# Patient Record
Sex: Male | Born: 1955 | Race: White | Hispanic: No | Marital: Single | Smoking: Former smoker
Health system: Southern US, Community
[De-identification: ages and names within clinical notes are randomized; demographics above are authoritative.]

## PROBLEM LIST (undated history)

## (undated) DIAGNOSIS — M1990 Unspecified osteoarthritis, unspecified site: Secondary | ICD-10-CM

## (undated) DIAGNOSIS — G8929 Other chronic pain: Secondary | ICD-10-CM

## (undated) DIAGNOSIS — I4891 Unspecified atrial fibrillation: Secondary | ICD-10-CM

## (undated) DIAGNOSIS — M549 Dorsalgia, unspecified: Secondary | ICD-10-CM

## (undated) DIAGNOSIS — I10 Essential (primary) hypertension: Secondary | ICD-10-CM

## (undated) DIAGNOSIS — E785 Hyperlipidemia, unspecified: Secondary | ICD-10-CM

## (undated) HISTORY — PX: TONSILLECTOMY: SUR1361

---

## 2015-02-25 ENCOUNTER — Ambulatory Visit
Admission: RE | Admit: 2015-02-25 | Discharge: 2015-02-25 | Disposition: A | Discharge status: Home / Self Care | Payer: Disability Insurance | Source: Ambulatory Visit

## 2015-02-25 ENCOUNTER — Other Ambulatory Visit: Payer: Self-pay

## 2015-02-25 DIAGNOSIS — G8929 Other chronic pain: Secondary | ICD-10-CM | POA: Diagnosis not present

## 2015-02-25 DIAGNOSIS — M259 Joint disorder, unspecified: Secondary | ICD-10-CM

## 2015-02-25 DIAGNOSIS — M19071 Primary osteoarthritis, right ankle and foot: Secondary | ICD-10-CM | POA: Diagnosis not present

## 2015-02-25 DIAGNOSIS — M4186 Other forms of scoliosis, lumbar region: Secondary | ICD-10-CM | POA: Insufficient documentation

## 2015-02-25 DIAGNOSIS — M539 Dorsopathy, unspecified: Secondary | ICD-10-CM | POA: Diagnosis not present

## 2015-02-25 DIAGNOSIS — M25561 Pain in right knee: Secondary | ICD-10-CM | POA: Diagnosis not present

## 2015-04-19 ENCOUNTER — Emergency Department
Admission: EM | Admit: 2015-04-19 | Discharge: 2015-04-19 | Disposition: A | Discharge status: Home / Self Care | Payer: Medicaid Other | Attending: Emergency Medicine | Admitting: Emergency Medicine

## 2015-04-19 DIAGNOSIS — M542 Cervicalgia: Secondary | ICD-10-CM | POA: Diagnosis not present

## 2015-04-19 DIAGNOSIS — M549 Dorsalgia, unspecified: Secondary | ICD-10-CM | POA: Insufficient documentation

## 2015-04-19 DIAGNOSIS — I10 Essential (primary) hypertension: Secondary | ICD-10-CM | POA: Insufficient documentation

## 2015-04-19 DIAGNOSIS — G8929 Other chronic pain: Secondary | ICD-10-CM

## 2015-04-19 DIAGNOSIS — Z87891 Personal history of nicotine dependence: Secondary | ICD-10-CM | POA: Insufficient documentation

## 2015-04-19 HISTORY — DX: Essential (primary) hypertension: I10

## 2015-04-19 MED ORDER — IBUPROFEN 800 MG PO TABS
800.0000 mg | ORAL_TABLET | ORAL | Status: AC
  Administered 2015-04-19: 800 mg via ORAL
  Filled 2015-04-19: qty 1

## 2015-04-19 NOTE — ED Provider Notes (Signed)
[facility] Emergency Department Provider Note  ____________________________________________  Time seen: Approximately 6:27 PM  I have reviewed the triage vital signs and the nursing notes.   HISTORY  Chief Complaint Neck Pain and Back Pain    HPI Russell Jacobson is a 60 y.o. male reports that he has a history of high blood pressure and severe chronic pain in his neck and back.  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 Today he was out for a walk and he had a flare of the pain which she does experience from time to time, but he was in a public place. The pain would become severe it shoots down his left neck and his left leg sometimes he has to sit down. He uses a cane due to chronic weakness in the left leg from his back. He had to lower himself to the ground and the paramedics were called by witnesses.  Patient reports a severe shooting pain in the left neck that shoots from the left side of the neck into the left shoulder. He denies any new pain in his lower back. He did not fall or injure himself. He does report he has had this pain several times before and that usually he takes ibuprofen help quell the pain.  He has no new weakness. Denies chest pain or trouble breathing. He has not been sick recently.  The patient does see Dr. Maryellen Pile and reports he is currently getting evaluated for disability through him for the same.  I offered the patient pain medicine including Vicodin as he looks like he is in severe pain, but he refused it. Patient states he would only want to take ibuprofen and does not wish to be given any strong pain medicine at all.   Past Medical History  Diagnosis Date  . Hypertension     Hypertension  There are no active problems to display for this patient.   Past Surgical History  Procedure  Laterality Date  . Tonsillectomy    . Knee surgery      No current outpatient prescriptions on file.  Allergies Review of patient's allergies indicates no known allergies.  No family history on file.  Social History Social History  Substance Use Topics  . Smoking status: None  . Smokeless tobacco: None  . Alcohol Use: Yes     Comment: 12oz whiskey daily  History of smoking Uses alcohol, occasional daily use.  Review of Systems Constitutional: No fever/chills Eyes: No visual changes. ENT: No sore throat. Cardiovascular: Denies chest pain. Respiratory: Denies shortness of breath. Gastrointestinal: No abdominal pain.  No nausea, no vomiting.  No diarrhea.  No constipation. Genitourinary: Negative for dysuria. Musculoskeletal: Negative for back pain. Skin: Negative for rash. Neurological: Negative for headaches, focal weakness or numbness except for some chronic numbness in the foot and the left lower leg and some mild weakness in the left lower leg that he's had for years. No bowel or bladder incontinence.  He denies falling striking his head or injuring his neck.  10-point ROS otherwise negative.  ____________________________________________   PHYSICAL EXAM:  VITAL SIGNS: ED Triage Vitals  Enc Vitals Group     BP --      Pulse --      Resp --      Temp --  Temp src --      SpO2 --      Weight --      Height --      Head Cir --      Peak Flow --      Pain Score --      Pain Loc --      Pain Edu? --      Excl. in GC? --    Constitutional: Alert and oriented. Well appearing and in no acute distress though he does appear to be in moderate pain. Eyes: Conjunctivae are normal. PERRL. EOMI. Head: Atraumatic. Nose: No congestion/rhinnorhea. Mouth/Throat: Mucous membranes are moist.  Oropharynx non-erythematous. Neck: No stridor.  No midline cervical tenderness, but the patient does report pain over the left paraspinous muscles. Cardiovascular: Normal rate,  regular rhythm. Grossly normal heart sounds.  Good peripheral circulation. Respiratory: Normal respiratory effort.  No retractions. Lungs CTAB. Gastrointestinal: Soft and nontender. No distention. No abdominal bruits. No CVA tenderness. Musculoskeletal: No lower extremity tenderness nor edema.  No joint effusions. Mild dulling of sensation across the left lower leg which the patient reports is chronic.  RIGHT Right upper extremity demonstrates normal strength, good use of all muscles. No edema bruising or contusions of the right shoulder/upper arm, right elbow, right forearm / hand. Full range of motion of the right right upper extremity without pain. No evidence of trauma. Strong radial pulse. Intact median/ulnar/radial neuro-muscular exam.  LEFT Left upper extremity demonstrates normal strength, good use of all muscles. No edema bruising or contusions of the left shoulder/upper arm, left elbow, left forearm / hand. Full range of motion of the left  upper extremity without pain. No evidence of trauma. Strong radial pulse. Intact median/ulnar/radial neuro-muscular exam.  Lower Extremities  No edema. Normal DP/PT pulses bilateral with good cap refill.  Normal neuro-motor function lower extremities bilateral.  RIGHT Right lower extremity demonstrates normal strength, good use of all muscles. No edema bruising or contusions of the right hip, right knee, right ankle. Full range of motion of the right lower extremity without pain. No pain on axial loading. No evidence of trauma.  LEFT Left lower extremity demonstrates normal strength, good use of all muscles rated 5 out of 5 however compared to the left there does seem to be some slight weakness in the hip flexors and extensors, the patient reports that this is been a chronic problem and is unchanged for approximately 5+ years. No edema bruising or contusions of the hip,  knee, ankle. Full range of motion of the left lower extremity without pain. No  pain on axial loading. No evidence of trauma.  Neurologic:  Normal speech and language. No gross focal neurologic deficits are appreciated. Does have some just very mild weakness in the left hip flexors and extensors which she reports is chronic due to problems with his back.Skin:  Skin is warm, dry and intact. No rash noted. Psychiatric: Mood and affect are normal. Speech and behavior are normal.  ____________________________________________   LABS (all labs ordered are listed, but only abnormal results are displayed)  Labs Reviewed - No data to display ____________________________________________  EKG   ____________________________________________  RADIOLOGY   ____________________________________________   PROCEDURES  Procedure(s) performed: None  Critical Care performed: No  ____________________________________________   INITIAL IMPRESSION / ASSESSMENT AND PLAN / ED COURSE  Pertinent labs & imaging results that were available during my care of the patient were reviewed by me and considered in my medical decision making (see chart  for details).  Patient presents for evaluation of pain. He reports that he's had the same pain many many times, been an ongoing issue since being injured [place]. He is currently followed with disability. He has no evidence of acute neurologic deficit except for what he reports is chronic weakness and no changes. He does appear uncomfortable but refuses narcotic pain medicine only wishes for ibuprofen for treatment. He denies any injury or trauma. On exam, overall very reassuring. No signs or symptoms suggest acute cauda equina or severe neurologic deficit. No fevers chills. No cardiopulmonary symptoms. EKG reassuring.  He reports he has been seeing Dr. Maryellen Pile and had x-rays done and is currently under care for follow-up on this ongoing back discomfort.  After receiving ibuprofen the patient is sitting up, feeling well, reports his pain is much  better. She is able to ambulate well. We'll discharge the patient home, traditional back pain precautions given to the patient who is in agreeable and will follow up with primary care. ____________________________________________   FINAL CLINICAL IMPRESSION(S) / ED DIAGNOSES  Final diagnoses:  Chronic back pain      Sharyn Creamer, MD 04/20/15 [phone]

## 2015-04-19 NOTE — ED Notes (Signed)
Patient with no complaints at this time. Respirations even and unlabored. Skin warm/dry. Discharge instructions reviewed with patient at this time. Patient given opportunity to voice concerns/ask questions. Patient discharged at this time and left Emergency Department with steady gait.   

## 2015-04-19 NOTE — ED Notes (Signed)
Pt came from home via EMS c/o neck and back pain. Pt reports taking his daily walk and he felt a shooting pain go up his leg. Pt reports his legs gave out and and he fell. Pt reports no LOC or head injury. Pt has chronic nerve pain due to a work injury 3 years ago.

## 2015-04-19 NOTE — Discharge Instructions (Signed)
°Please follow up with your doctor as soon as possible regarding today's ED visit and your back pain.  Return to the ED for worsening back pain, fever, weakness or numbness of either leg, or if you develop either (1) an inability to urinate or have bowel movements, or (2) loss of your ability to control your bathroom functions (if you start having "accidents"), or if you develop other new symptoms that concern you. ° ° °Back Pain, Adult °Back pain is very common in adults. The cause of back pain is rarely dangerous and the pain often gets better over time. The cause of your back pain may not be known. Some common causes of back pain include: °· Strain of the muscles or ligaments supporting the spine. °· Wear and tear (degeneration) of the spinal disks. °· Arthritis. °· Direct injury to the back. °For many people, back pain may return. Since back pain is rarely dangerous, most people can learn to manage this condition on their own. °HOME CARE INSTRUCTIONS °Watch your back pain for any changes. The following actions may help to lessen any discomfort you are feeling: °· Remain active. It is stressful on your back to sit or stand in one place for long periods of time. Do not sit, drive, or stand in one place for more than 30 minutes at a time. Take short walks on even surfaces as soon as you are able. Try to increase the length of time you walk each day. °· Exercise regularly as directed by your health care provider. Exercise helps your back heal faster. It also helps avoid future injury by keeping your muscles strong and flexible. °· Do not stay in bed. Resting more than 1-2 days can delay your recovery. °· Pay attention to your body when you bend and lift. The most comfortable positions are those that put less stress on your recovering back. Always use proper lifting techniques, including: °¨ Bending your knees. °¨ Keeping the load close to your body. °¨ Avoiding twisting. °· Find a comfortable position to sleep. Use  a firm mattress and lie on your side with your knees slightly bent. If you lie on your back, put a pillow under your knees. °· Avoid feeling anxious or stressed. Stress increases muscle tension and can worsen back pain. It is important to recognize when you are anxious or stressed and learn ways to manage it, such as with exercise. °· Take medicines only as directed by your health care provider. Over-the-counter medicines to reduce pain and inflammation are often the most helpful. Your health care provider may prescribe muscle relaxant drugs. These medicines help dull your pain so you can more quickly return to your normal activities and healthy exercise. °· Apply ice to the injured area: °¨ Put ice in a plastic bag. °¨ Place a towel between your skin and the bag. °¨ Leave the ice on for 20 minutes, 2-3 times a day for the first 2-3 days. After that, ice and heat may be alternated to reduce pain and spasms. °· Maintain a healthy weight. Excess weight puts extra stress on your back and makes it difficult to maintain good posture. °SEEK MEDICAL CARE IF: °· You have pain that is not relieved with rest or medicine. °· You have increasing pain going down into the legs or buttocks. °· You have pain that does not improve in one week. °· You have night pain. °· You lose weight. °· You have a fever or chills. °SEEK IMMEDIATE MEDICAL CARE IF:  °· You develop new bowel or bladder control   problems. °· You have unusual weakness or numbness in your arms or legs. °· You develop nausea or vomiting. °· You develop abdominal pain. °· You feel faint. °  °This information is not intended to replace advice given to you by your health care provider. Make sure you discuss any questions you have with your health care provider. °  °Document Released: 01/17/2005 Document Revised: 02/07/2014 Document Reviewed: 05/21/2013 °Elsevier Interactive Patient Education ©2016 [employer] ° °

## 2015-04-19 NOTE — ED Notes (Signed)
Pt reports being uncomfortable and would feel more comfortable in a different position. Pt given recliner chair.

## 2015-07-31 ENCOUNTER — Other Ambulatory Visit: Payer: Self-pay | Admitting: Surgery

## 2015-07-31 DIAGNOSIS — R1909 Other intra-abdominal and pelvic swelling, mass and lump: Secondary | ICD-10-CM

## 2015-08-03 ENCOUNTER — Ambulatory Visit: Payer: Medicaid Other

## 2015-08-10 ENCOUNTER — Ambulatory Visit: Admission: RE | Admit: 2015-08-10 | Payer: Medicaid Other | Source: Ambulatory Visit

## 2016-07-12 ENCOUNTER — Other Ambulatory Visit: Payer: Self-pay

## 2016-07-12 ENCOUNTER — Telehealth: Payer: Self-pay

## 2016-07-12 DIAGNOSIS — Z1211 Encounter for screening for malignant neoplasm of colon: Secondary | ICD-10-CM

## 2016-07-12 NOTE — Telephone Encounter (Signed)
Gastroenterology Pre-Procedure Review  Request Date: 07/22/16 Requesting Physician: Dr. Servando SnareWohl  PATIENT REVIEW QUESTIONS: The patient responded to the following health history questions as indicated:    1. Are you having any GI issues? yes (Diarrhea) 2. Do you have a personal history of Polyps? no 3. Do you have a family history of Colon Cancer or Polyps? no 4. Diabetes Mellitus? no 5. Joint replacements in the past 12 months?no 6. Major health problems in the past 3 months?no 7. Any artificial heart valves, MVP, or defibrillator?no    MEDICATIONS & ALLERGIES:    Patient reports the following regarding taking any anticoagulation/antiplatelet therapy:   Plavix, Coumadin, Eliquis, Xarelto, Lovenox, Pradaxa, Brilinta, or Effient? yes (Xarelto Rx by Dr. Salome ArntFath(pt stated) will send blood thinner request to  physician) Aspirin? no  Patient confirms/reports the following medications:  No current outpatient prescriptions on file.   No current facility-administered medications for this visit.     Patient confirms/reports the following allergies:  No Known Allergies  No orders of the defined types were placed in this encounter.   AUTHORIZATION INFORMATION Primary Insurance: 1D#: Group #:  Secondary Insurance: 1D#: Group #:  SCHEDULE INFORMATION: Date: 07/22/16 Time: Location:[facility]

## 2016-07-13 ENCOUNTER — Encounter: Payer: Self-pay | Admitting: *Deleted

## 2016-07-14 ENCOUNTER — Encounter: Payer: Self-pay | Admitting: Anesthesiology

## 2016-07-18 ENCOUNTER — Telehealth: Payer: Self-pay | Admitting: Gastroenterology

## 2016-07-18 NOTE — Telephone Encounter (Signed)
Pts colonoscopy has been rescheduled to 08/19/16 because his sons work schedule. Kim in [place] has been notified to make appt change. Pt has been asked to adjust his dates on the colonoscopy instruction sheet.

## 2016-07-18 NOTE — Telephone Encounter (Signed)
Patient needs to r/s his procedure due to no one able to be with him. Wants to r/s for three weeks out.

## 2016-08-12 ENCOUNTER — Telehealth: Payer: Self-pay

## 2016-08-12 NOTE — Telephone Encounter (Signed)
LVM for patient callback to reschedule procedure due to Dr. Servando SnareWohl request.

## 2016-08-19 ENCOUNTER — Ambulatory Visit: Admission: RE | Admit: 2016-08-19 | Payer: Medicaid Other | Source: Ambulatory Visit | Admitting: Gastroenterology

## 2016-08-19 HISTORY — DX: Other chronic pain: G89.29

## 2016-08-19 HISTORY — DX: Hyperlipidemia, unspecified: E78.5

## 2016-08-19 HISTORY — DX: Unspecified osteoarthritis, unspecified site: M19.90

## 2016-08-19 HISTORY — DX: Dorsalgia, unspecified: M54.9

## 2016-08-19 HISTORY — DX: Unspecified atrial fibrillation: I48.91

## 2016-08-19 SURGERY — COLONOSCOPY WITH PROPOFOL
Anesthesia: Choice

## 2016-12-19 IMAGING — CR DG LUMBAR SPINE 2-3V
3 series · 3 of 3 positions shown · non-contrast
Comparison: None.

CLINICAL DATA: Injury 4 years ago.  Pain.

EXAM:
LUMBAR SPINE - 2-3 VIEW

[l-spine ap]
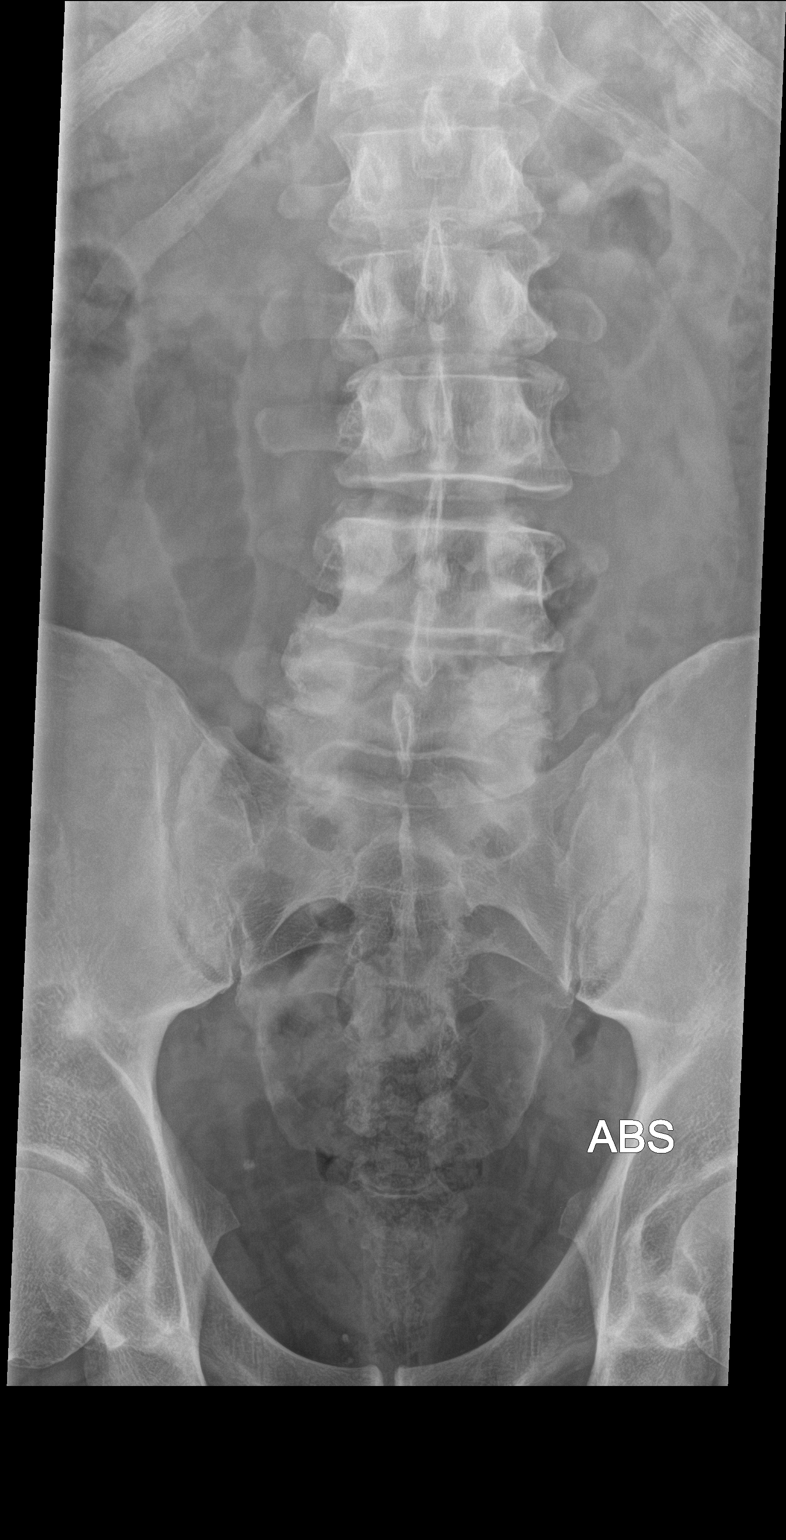

[l-spine lat]
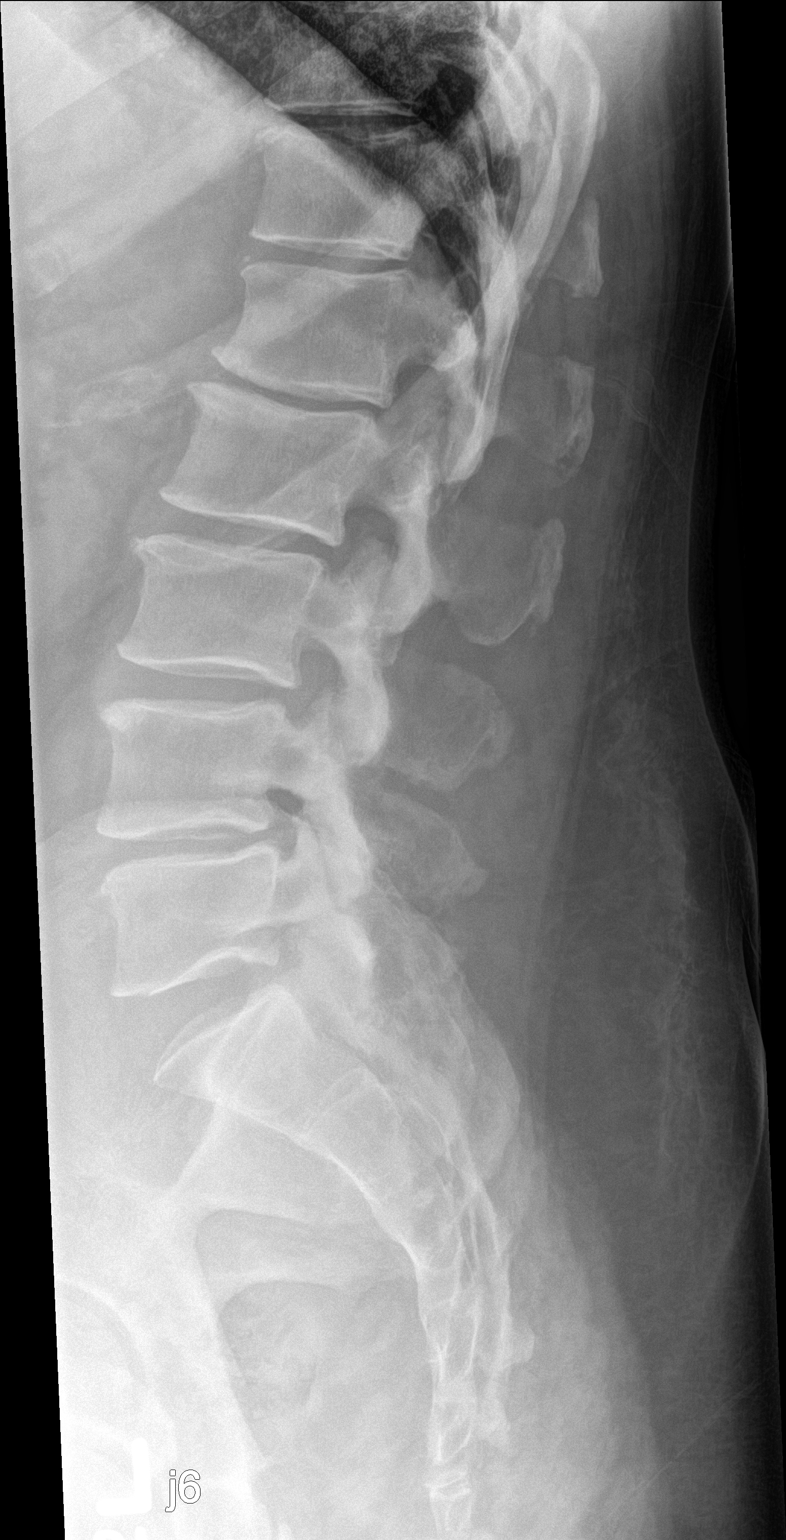

[l-spine spot]
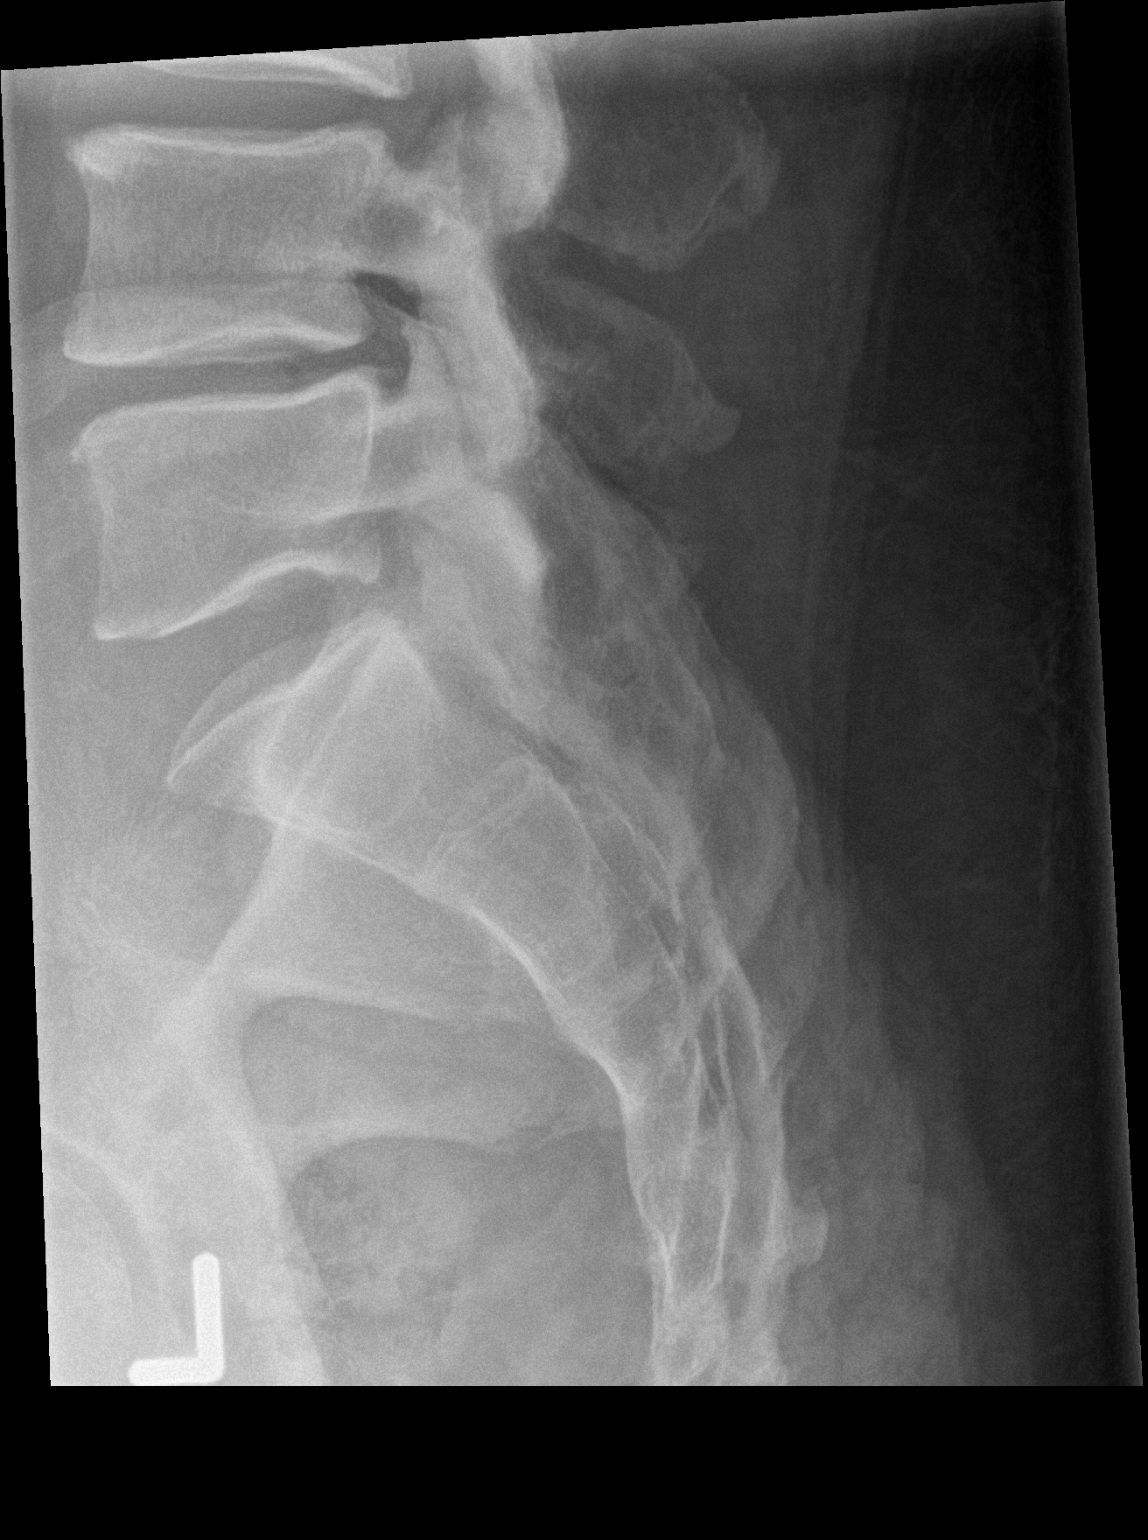

[3 of 3 positions shown; findings below may reference images not displayed]

FINDINGS: Paraspinal soft tissues are normal. Diffuse multilevel degenerative
change. Mild scoliosis concave right. No acute bony abnormality
identified. Pelvic calcifications consistent phleboliths. Sclerotic
density noted over the right ilium, most likely bone island.
Although blastic metastatic focus cannot be excluded, no other focal
bony abnormalities identified.
IMPRESSION: Multilevel degenerative change lumbar spine. No acute bony
abnormality . Sclerotic density noted projected over the right
ilium. Although a blastic metastatic focus cannot be completely
excluded, no other focal abnormalities identified. This lesion most
likely represents a benign bone island.

## 2016-12-19 IMAGING — CR DG ANKLE 2V *R*
2 series · 2 of 2 positions shown · non-contrast
Comparison: None in PACs

CLINICAL DATA: Chronic right ankle pain made worse following an
accident 4 years ago.

EXAM:
RIGHT ANKLE - 2 VIEW

[ankle ap]
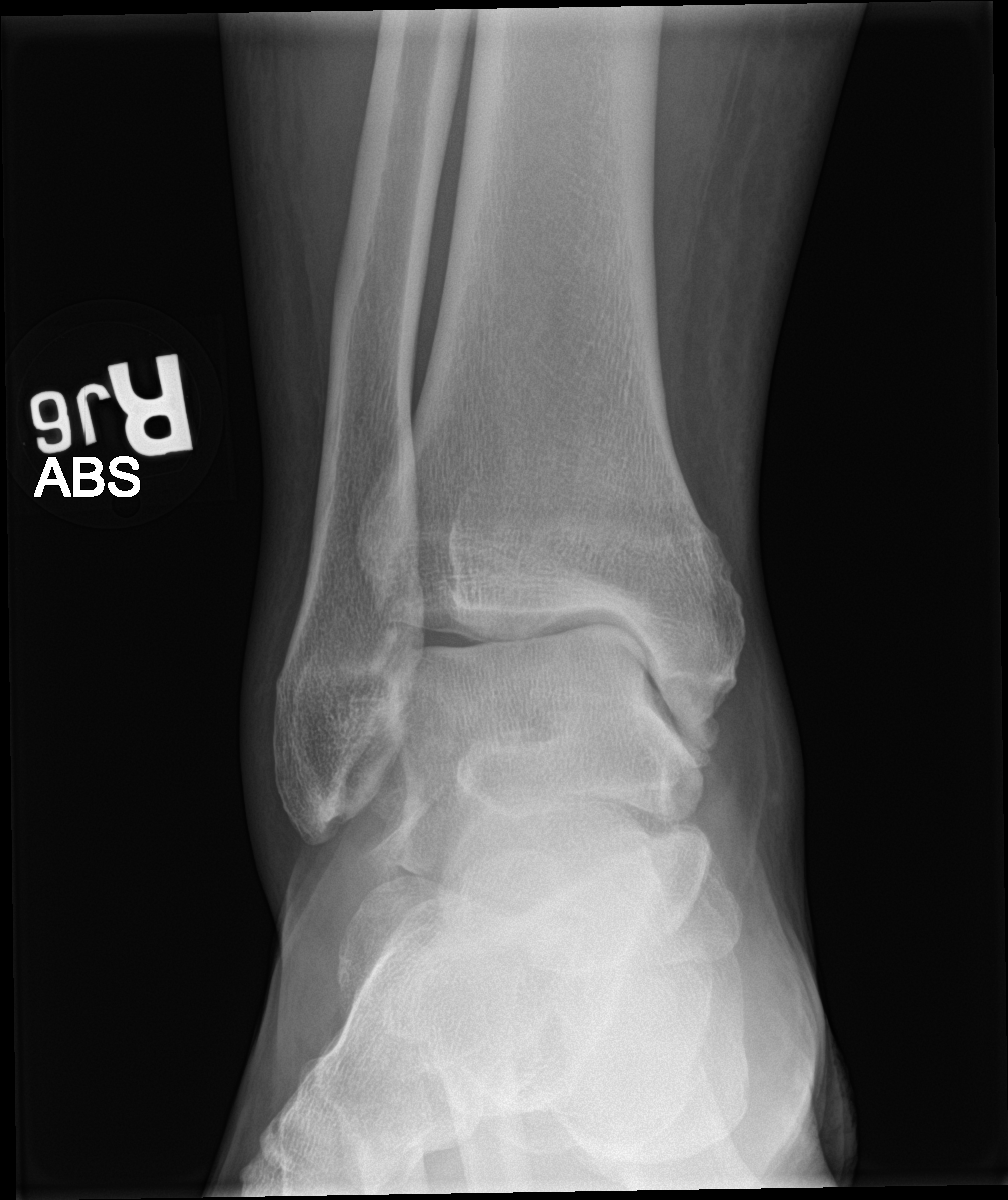

[ankle lat]
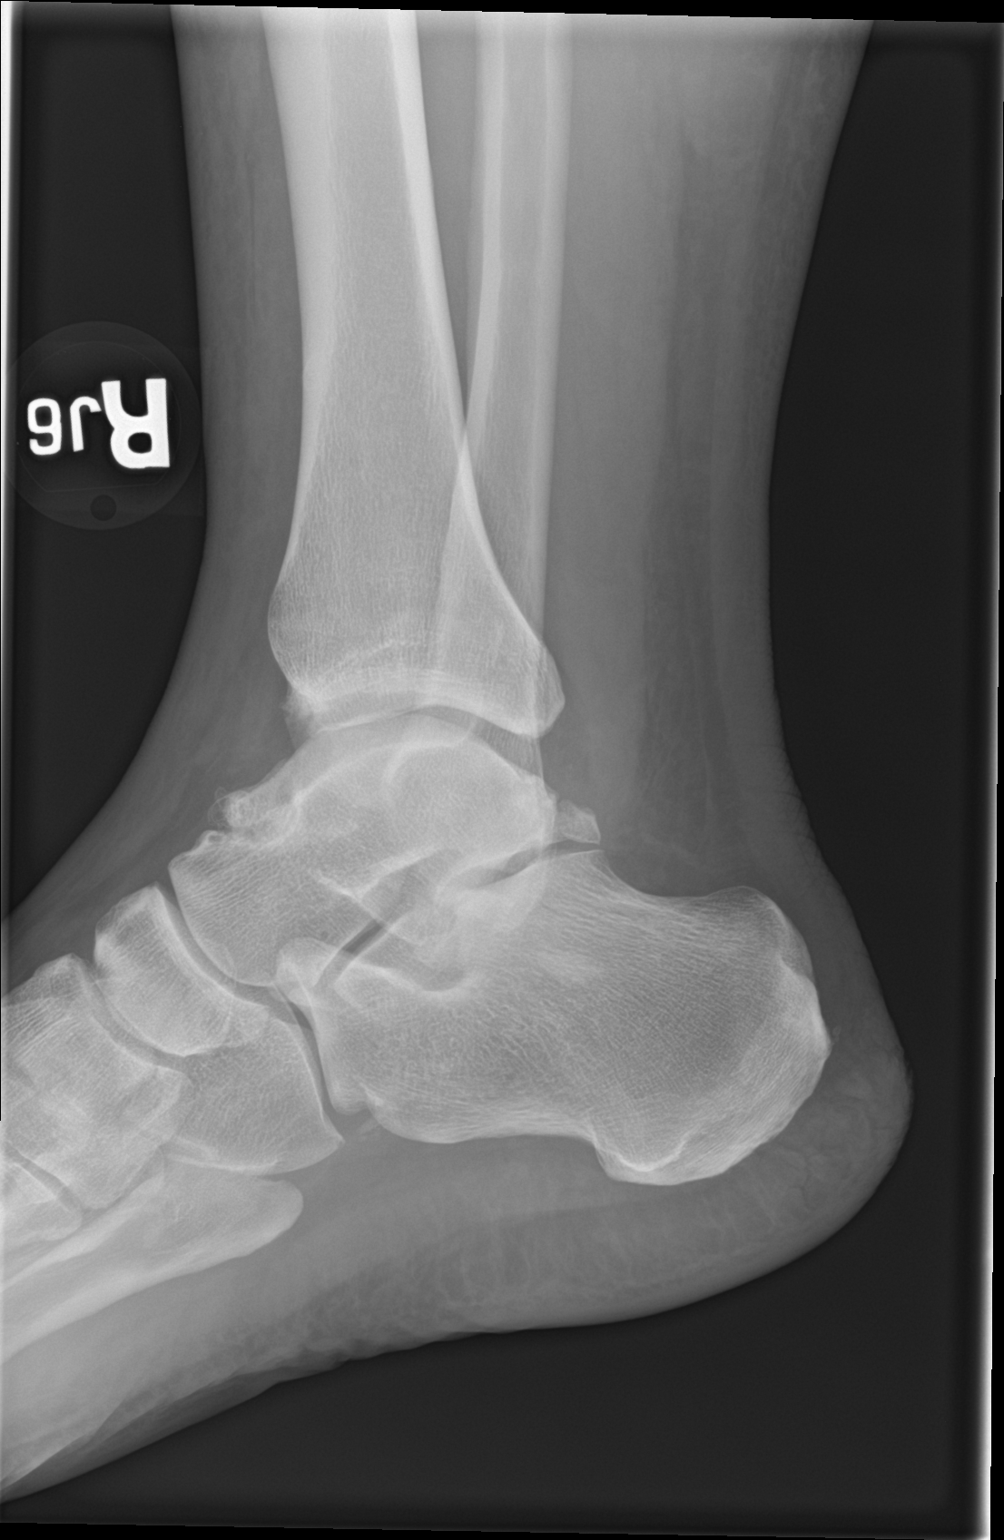

[2 of 2 positions shown; findings below may reference images not displayed]

FINDINGS: The bones are adequately mineralized. There is narrowing of the
medial aspect of the joint mortise. The tibial plateau aunt and the
talar dome are intact. There is no evidence of an acute or old
malleolar fracture. The calcaneus is unremarkable. There is mild
soft tissue swelling diffusely.
IMPRESSION: There is moderate degenerative change involving the medial aspect of
the ankle joint mortise. There is no acute bony abnormality.

## 2017-03-14 ENCOUNTER — Other Ambulatory Visit: Payer: Self-pay
